# Patient Record
Sex: Male | Born: 1990 | Hispanic: No | Marital: Married | Smoking: Current every day smoker
Health system: Southern US, Community
[De-identification: ages and names within clinical notes are randomized; demographics above are authoritative.]

## PROBLEM LIST (undated history)

## (undated) DIAGNOSIS — F419 Anxiety disorder, unspecified: Secondary | ICD-10-CM

## (undated) DIAGNOSIS — D649 Anemia, unspecified: Secondary | ICD-10-CM

## (undated) DIAGNOSIS — R519 Headache, unspecified: Secondary | ICD-10-CM

## (undated) DIAGNOSIS — K5909 Other constipation: Secondary | ICD-10-CM

## (undated) HISTORY — DX: Other constipation: K59.09

## (undated) HISTORY — DX: Anemia, unspecified: D64.9

## (undated) HISTORY — DX: Headache, unspecified: R51.9

## (undated) HISTORY — DX: Anxiety disorder, unspecified: F41.9

---

## 2020-02-23 ENCOUNTER — Ambulatory Visit: Payer: Self-pay | Admitting: Family Medicine

## 2020-02-23 ENCOUNTER — Encounter: Payer: Self-pay | Admitting: Family Medicine

## 2020-02-23 ENCOUNTER — Other Ambulatory Visit: Payer: Self-pay

## 2020-02-23 VITALS — BP 110/61 | HR 65 | Temp 97.7°F | Resp 16 | Ht 70.25 in | Wt 145.0 lb

## 2020-02-23 DIAGNOSIS — Z7689 Persons encountering health services in other specified circumstances: Secondary | ICD-10-CM

## 2020-02-23 DIAGNOSIS — D509 Iron deficiency anemia, unspecified: Secondary | ICD-10-CM

## 2020-02-23 DIAGNOSIS — Z833 Family history of diabetes mellitus: Secondary | ICD-10-CM

## 2020-02-23 DIAGNOSIS — K5900 Constipation, unspecified: Secondary | ICD-10-CM

## 2020-02-23 DIAGNOSIS — B769 Hookworm disease, unspecified: Secondary | ICD-10-CM

## 2020-02-23 MED ORDER — POLYETHYLENE GLYCOL 3350 17 GM/SCOOP PO POWD: 17.0000 g | Freq: Every day | ORAL | 1 refills | Status: AC | PRN

## 2020-02-23 MED ORDER — ALBENDAZOLE 200 MG PO TABS: 400.0000 mg | ORAL_TABLET | Freq: Once | ORAL | 0 refills | Status: AC

## 2020-02-23 NOTE — Patient Instructions (Addendum)
Thank you for coming to the office today.  Take medicine for hookworm as discussed.  For Constipation (less frequent bowel movement that can be hard dry or involve straining).  Recommend trying OTC Miralax 17g = 1 capful in large glass water once daily for now, try several days to see if working, goal is soft stool or BM 1-2 times daily, if too loose then reduce dose or try every other day. If not effective may need to increase it to 2 doses at once in AM or may do 1 in morning and 1 in afternoon/evening  - This medicine is very safe and can be used often without any problem and will not make you dehydrated. It is good for use on AS NEEDED BASIS or even MAINTENANCE therapy for longer term for several days to weeks at a time to help regulate bowel movements  Other more natural remedies or preventative treatment: - Increase hydration with water - Increase fiber in diet (high fiber foods = vegetables, leafy greens, oats/grains) - May take OTC Fiber supplement (metamucil powder or pill/gummy) - May try OTC Probiotic  We will look into the cost of the Stool Test - for [facility] Test Code 4792  Please schedule a Follow-up Appointment to: Return in about 26 days (around 03/20/2020) for 4 weeks 8/24 - constipation follow-up, close to time/day as his spouse.  If you have any other questions or concerns, please feel free to call the office or send a message through MyChart. You may also schedule an earlier appointment if necessary.  Additionally, you may be receiving a survey about your experience at our office within a few days to 1 week by e-mail or mail. We value your feedback.  Saralyn Pilar, DO [facility], [place]

## 2020-02-23 NOTE — Progress Notes (Signed)
Subjective:    Patient ID: Eric Singleton, male    DOB: [birth year], 29 y.o.   MRN: [id number]  Eric Singleton is a 29 y.o. male presenting on 02/23/2020 for Establish Care (chronic constipation)  Here to establish new patient. He is accompanied by his wife, Eric Singleton. He prefers [place] Patois/Patwa and does understand most English. He has moved here from [place] recently within the past 2 years.  HPI   Chronic Constipation Reports chronic problem. Describes harder dry stool, smaller amount 1 x BM daily, with some straining. Tried OTC medication, Senna plus, Dulcolax, Heated prune juice, milk of magnesia, miralax low doses temporary relief but not regular benefit - Asking about lactose intolerance, can have bloating and cramping. - Admits gas build-up. Taking Gas-X PRN - Tried Probiotic - Florastor limited relief , he does not like taking pills - Worried about hookworm, has been diagnosed before in [place], has taken treatment before, requesting medicine for this now.  History of Anemia  Adjustment Disorder with Depression / Anxiety Reports various stressors causing him issues with mood and anxiety. No prior formal diagnosis of major depression. Never treated with medication for this before. His wife admits that both she and some aspects of life in [place] are factors for his stress.   Depression screen PHQ 2/9 02/23/2020  Decreased Interest 2  Down, Depressed, Hopeless 1  PHQ - 2 Score 3  Altered sleeping 1  Tired, decreased energy 2  Change in appetite 3  Feeling bad or failure about yourself  0  Trouble concentrating 2  Moving slowly or fidgety/restless 0  Suicidal thoughts 0  PHQ-9 Score 11  Difficult doing work/chores Somewhat difficult     GAD 7 : Generalized Anxiety Score 02/23/2020  Nervous, Anxious, on Edge 1  Control/stop worrying 2  Worry too much - different things 1  Trouble relaxing 2  Restless 1  Easily annoyed or irritable 2  Afraid - awful might happen 1  Total GAD  7 Score 10  Anxiety Difficulty Somewhat difficult     Past Medical History:  Diagnosis Date  . Anemia   . Anxiety   . Chronic constipation   . Headache    History reviewed. No pertinent surgical history. Social History   Socioeconomic History  . Marital status: Married    Spouse name: Not on file  . Number of children: Not on file  . Years of education: Not on file  . Highest education level: Not on file  Occupational History  . Not on file  Tobacco Use  . Smoking status: Never Smoker  . Smokeless tobacco: Never Used  Substance and Sexual Activity  . Alcohol use: Yes  . Drug use: Never  . Sexual activity: Not on file  Other Topics Concern  . Not on file  Social History Narrative  . Not on file   Social Determinants of Health   Financial Resource Strain:   . Difficulty of Paying Living Expenses:   Food Insecurity:   . Worried About Programme researcher, broadcasting/film/video in the Last Year:   . Barista in the Last Year:   Transportation Needs:   . Freight forwarder (Medical):   Marland Kitchen Lack of Transportation (Non-Medical):   Physical Activity:   . Days of Exercise per Week:   . Minutes of Exercise per Session:   Stress:   . Feeling of Stress :   Social Connections:   . Frequency of Communication with Friends and Family:   .  Frequency of Social Gatherings with Friends and Family:   . Attends Religious Services:   . Active Member of Clubs or Organizations:   . Attends Banker Meetings:   Marland Kitchen Marital Status:   Intimate Partner Violence:   . Fear of Current or Ex-Partner:   . Emotionally Abused:   Marland Kitchen Physically Abused:   . Sexually Abused:    History reviewed. No pertinent family history. Current Outpatient Medications on File Prior to Visit  Medication Sig  . famotidine (PEPCID) 10 MG tablet Take 10 mg by mouth 2 (two) times daily.  Marland Kitchen senna-docusate (SENOKOT-S) 8.6-50 MG tablet Take 1 tablet by mouth daily.   No current facility-administered medications on  file prior to visit.    Review of Systems Per HPI unless specifically indicated above      Objective:    BP (!) 110/61   Pulse 65   Temp 97.7 F (36.5 C) (Temporal)   Resp 16   Ht 5' 10.25" (1.784 m)   Wt 145 lb (65.8 kg)   SpO2 100%   BMI 20.66 kg/m   Wt Readings from Last 3 Encounters:  02/23/20 145 lb (65.8 kg)    Physical Exam Vitals and nursing note reviewed.  Constitutional:      General: He is not in acute distress.    Appearance: He is well-developed. He is not diaphoretic.     Comments: Well-appearing, comfortable, cooperative  HENT:     Head: Normocephalic and atraumatic.  Eyes:     General:        Right eye: No discharge.        Left eye: No discharge.     Conjunctiva/sclera: Conjunctivae normal.     Pupils: Pupils are equal, round, and reactive to light.  Neck:     Thyroid: No thyromegaly.  Cardiovascular:     Rate and Rhythm: Normal rate and regular rhythm.     Heart sounds: Normal heart sounds. No murmur heard.   Pulmonary:     Effort: Pulmonary effort is normal. No respiratory distress.     Breath sounds: Normal breath sounds. No wheezing or rales.  Abdominal:     General: Bowel sounds are normal. There is no distension.     Palpations: Abdomen is soft. There is no mass.     Tenderness: There is no abdominal tenderness.  Musculoskeletal:        General: No tenderness. Normal range of motion.     Cervical back: Normal range of motion and neck supple.     Comments: Upper / Lower Extremities: - Normal muscle tone, strength bilateral upper extremities 5/5, lower extremities 5/5  Lymphadenopathy:     Cervical: No cervical adenopathy.  Skin:    General: Skin is warm and dry.     Findings: No erythema or rash.  Neurological:     Mental Status: He is alert and oriented to person, place, and time.     Comments: Distal sensation intact to light touch all extremities  Psychiatric:        Behavior: Behavior normal.     Comments: Well groomed, good  eye contact, normal speech and thoughts      No results found for this or any previous visit.    Assessment & Plan:   Problem List Items Addressed This Visit    None    Visit Diagnoses    Constipation, unspecified constipation type    -  Primary   Relevant Medications   polyethylene glycol  powder (GLYCOLAX/MIRALAX) 17 GM/SCOOP powder   Encounter to establish care with new doctor       Hookworm infection       Relevant Medications   albendazole (ALBENZA) 200 MG tablet   Iron deficiency anemia, unspecified iron deficiency anemia type       Relevant Orders   CBC with Differential/Platelet   Comprehensive metabolic panel   Iron, TIBC and Ferritin Panel   Family history of diabetes mellitus       Relevant Orders   Hemoglobin A1c      Regarding constipation Failed most therapy Trial of Miralax higher dose up to 1-4 caps daily recommended, goal to find balance for maintenance dose.  Encouraged improvement to lifestyle with diet and exercise Maintain weight Goal to improve appetite and diet, can take boost/ensure if needed for nutrition Did not offer appetite stimulant as asked by patient's wife  History of anemia - request lab Possible hookworm infection Reviewed species local to [place] Will trial on Albendazole 400mg  x 1 dose - x 2 of the 200mg  pills, goodrx coupon given, sent rx, empiric therapy, they decline stool test ova & parasite, [facility] diagnostic cost is $180 for stool study in future if indicated.  Cannot tolerate iron supplement due to constipation Will check labs - [facility] - CMET CBC A1c , Anemia panel  Meds ordered this encounter  Medications  . polyethylene glycol powder (GLYCOLAX/MIRALAX) 17 GM/SCOOP powder    Sig: Take 17 g by mouth daily as needed for mild constipation. May increase dose up to 2-4 capfuls per day or more if needed.    Dispense:  3350 g    Refill:  1  . albendazole (ALBENZA) 200 MG tablet    Sig: Take 2 tablets (400 mg total) by mouth  once for 1 dose.    Dispense:  2 tablet    Refill:  0      Follow up plan: Return in about 26 days (around 03/20/2020) for 4 weeks 8/24 - constipation follow-up, close to time/day as his spouse.  03/22/2020, DO [facility] 02/23/2020, 10:45 AM

## 2020-03-20 ENCOUNTER — Ambulatory Visit: Payer: Self-pay | Admitting: Family Medicine

## 2020-03-23 ENCOUNTER — Ambulatory Visit: Payer: Self-pay | Admitting: Family Medicine

## 2020-03-30 ENCOUNTER — Ambulatory Visit: Payer: Self-pay | Admitting: Family Medicine

## 2020-04-06 ENCOUNTER — Ambulatory Visit (INDEPENDENT_AMBULATORY_CARE_PROVIDER_SITE_OTHER): Payer: Self-pay | Admitting: Family Medicine

## 2020-04-06 ENCOUNTER — Other Ambulatory Visit: Payer: Self-pay

## 2020-04-06 ENCOUNTER — Encounter: Payer: Self-pay | Admitting: Family Medicine

## 2020-04-06 VITALS — BP 115/66 | HR 57 | Temp 97.3°F | Resp 16 | Ht 72.0 in | Wt 151.0 lb

## 2020-04-06 DIAGNOSIS — F419 Anxiety disorder, unspecified: Secondary | ICD-10-CM

## 2020-04-06 DIAGNOSIS — K5900 Constipation, unspecified: Secondary | ICD-10-CM

## 2020-04-06 DIAGNOSIS — E739 Lactose intolerance, unspecified: Secondary | ICD-10-CM | POA: Insufficient documentation

## 2020-04-06 NOTE — Progress Notes (Signed)
Subjective:    Patient ID: Eric Singleton, male    DOB: 09-21-1990, 29 y.o.   MRN: [id number]  Eric Singleton is a 29 y.o. male presenting on 04/06/2020 for Constipation (follow up improved)  Here to establish new patient. He is accompanied by his wife, [name]. He prefers [place] Patois/Patwa and does understand most English.  HPI  Chronic Constipation Weight Loss  Follow up from last appointment 01/2020, he was started on miralax and treated empirically for hookworm with albendazole Interval update, he has done very well He has gained weight 5-6 lbs, around 145 to 151 lbs in 2 months He is now taking miralax 1 dose DAILY with coffee and has more regular BMs He is lactose intolerant. He has switched to lactose free milk, and does better. Was having more bloating. Tried OTC medication, Senna PRN Improved diet Off lactose contain products, less bloating  Adjustment Disorder with Depression / Anxiety Improved depressive symptoms, now PHQ 0  He has some residual anxiety, see score below Not interested in treatment  Health Maintenance: Declines Flu vaccine at this time.  Depression screen [facility] 2/9 04/06/2020 02/23/2020  Decreased Interest 0 2  Down, Depressed, Hopeless 0 1  PHQ - 2 Score 0 3  Altered sleeping 0 1  Tired, decreased energy 0 2  Change in appetite 0 3  Feeling bad or failure about yourself  0 0  Trouble concentrating 0 2  Moving slowly or fidgety/restless 0 0  Suicidal thoughts 0 0  PHQ-9 Score 0 11  Difficult doing work/chores Not difficult at all Somewhat difficult   GAD 7 : Generalized Anxiety Score 02/23/2020  Nervous, Anxious, on Edge 1  Control/stop worrying 2  Worry too much - different things 1  Trouble relaxing 2  Restless 1  Easily annoyed or irritable 2  Afraid - awful might happen 1  Total GAD 7 Score 10  Anxiety Difficulty Somewhat difficult     Social History   Tobacco Use  . Smoking status: Never Smoker  . Smokeless tobacco: Never Used    Substance Use Topics  . Alcohol use: Yes  . Drug use: Never    Review of Systems Per HPI unless specifically indicated above     Objective:    BP 115/66   Pulse (!) 57   Temp (!) 97.3 F (36.3 C) (Temporal)   Resp 16   Ht 6' (1.829 m)   Wt 151 lb (68.5 kg)   SpO2 100%   BMI 20.48 kg/m   Wt Readings from Last 3 Encounters:  04/06/20 151 lb (68.5 kg)  02/23/20 145 lb (65.8 kg)    Physical Exam Vitals and nursing note reviewed.  Constitutional:      General: He is not in acute distress.    Appearance: He is well-developed. He is not diaphoretic.     Comments: Well-appearing, comfortable, cooperative  HENT:     Head: Normocephalic and atraumatic.  Eyes:     General:        Right eye: No discharge.        Left eye: No discharge.     Conjunctiva/sclera: Conjunctivae normal.  Neck:     Thyroid: No thyromegaly.  Cardiovascular:     Rate and Rhythm: Normal rate and regular rhythm.     Heart sounds: Normal heart sounds. No murmur heard.   Pulmonary:     Effort: Pulmonary effort is normal. No respiratory distress.     Breath sounds: Normal breath sounds. No wheezing or  rales.  Musculoskeletal:        General: Normal range of motion.     Cervical back: Normal range of motion and neck supple.  Lymphadenopathy:     Cervical: No cervical adenopathy.  Skin:    General: Skin is warm and dry.     Findings: No erythema or rash.  Neurological:     Mental Status: He is alert and oriented to person, place, and time.  Psychiatric:        Behavior: Behavior normal.     Comments: Well groomed, good eye contact, normal speech and thoughts      No results found for this or any previous visit.    Assessment & Plan:   Problem List Items Addressed This Visit    Lactose intolerance   Constipation - Primary      No orders of the defined types were placed in this encounter.  Improved constipation on miralax daily May titrate up PRN if need for flare Avoid lactose contain  products Encouraged improvement to lifestyle with diet and exercise Maintain weight Goal to improve appetite and diet, can take boost/ensure if needed for nutrition  Follow up anxiety if worse or new concerns or interested in treatment  Follow up plan: Return in about 1 year (around 04/06/2021) for [employer]  Declines labs at this time, he can come in AM fasting apt, and we can discuss option of lab in office, he has issue with nearly or passing out each time if blood draw or injection  Saralyn Pilar, DO [facility] 04/06/2020, 11:26 AM

## 2020-04-06 NOTE — Patient Instructions (Addendum)
Thank you for coming to the office today.  Keep up the good work. Continue miralax 1 cap daily.   DUE for FASTING BLOOD WORK (no food or drink after midnight before the lab appointment, only water or coffee without cream/sugar on the morning of)  SCHEDULE "Lab Only" visit in the morning at the clinic for lab draw in 1 YEAR  - Make sure Lab Only appointment is at about 1 week before your next appointment, so that results will be available  For Lab Results, once available within 2-3 days of blood draw, you can can log in to MyChart online to view your results and a brief explanation. Also, we can discuss results at next follow-up visit.   Please schedule a Follow-up Appointment to: Return in about 1 year (around 04/06/2021) for [employer]  If you have any other questions or concerns, please feel free to call the office or send a message through MyChart. You may also schedule an earlier appointment if necessary.  Additionally, you may be receiving a survey about your experience at our office within a few days to 1 week by e-mail or mail. We value your feedback.  Saralyn Pilar, DO [facility], [place]

## 2020-04-20 ENCOUNTER — Telehealth: Payer: Self-pay | Admitting: Family Medicine

## 2020-08-04 ENCOUNTER — Emergency Department: Payer: No Typology Code available for payment source

## 2020-08-04 ENCOUNTER — Other Ambulatory Visit: Payer: Self-pay

## 2020-08-04 DIAGNOSIS — S8991XA Unspecified injury of right lower leg, initial encounter: Secondary | ICD-10-CM | POA: Diagnosis not present

## 2020-08-04 DIAGNOSIS — S3992XA Unspecified injury of lower back, initial encounter: Secondary | ICD-10-CM | POA: Diagnosis present

## 2020-08-04 DIAGNOSIS — Y92410 Unspecified street and highway as the place of occurrence of the external cause: Secondary | ICD-10-CM | POA: Insufficient documentation

## 2020-08-04 DIAGNOSIS — T1490XA Injury, unspecified, initial encounter: Secondary | ICD-10-CM
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
## 2020-08-05 ENCOUNTER — Emergency Department
Admission: EM | Admit: 2020-08-05 | Discharge: 2020-08-05 | Disposition: A | Discharge status: Home / Self Care | Payer: No Typology Code available for payment source | Attending: Emergency Medicine | Admitting: Emergency Medicine

## 2020-08-05 ENCOUNTER — Emergency Department: Payer: No Typology Code available for payment source

## 2020-08-05 ENCOUNTER — Encounter: Payer: Self-pay | Admitting: Radiology

## 2020-08-05 DIAGNOSIS — T1490XA Injury, unspecified, initial encounter: Secondary | ICD-10-CM

## 2020-08-05 MED ORDER — OXYCODONE-ACETAMINOPHEN 5-325 MG PO TABS
1.0000 | ORAL_TABLET | Freq: Once | ORAL | Status: AC
  Administered 2020-08-05: 1 via ORAL
  Filled 2020-08-05: qty 1

## 2020-08-05 MED ORDER — IBUPROFEN 800 MG PO TABS: 800.0000 mg | ORAL_TABLET | Freq: Three times a day (TID) | ORAL | 0 refills | Status: DC | PRN

## 2020-08-05 MED ORDER — CYCLOBENZAPRINE HCL 10 MG PO TABS: 10.0000 mg | ORAL_TABLET | Freq: Three times a day (TID) | ORAL | 0 refills | Status: DC | PRN
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
## 2020-08-07 ENCOUNTER — Ambulatory Visit: Payer: Self-pay | Admitting: Family Medicine

## 2020-08-07 ENCOUNTER — Encounter: Payer: Self-pay | Admitting: Family Medicine

## 2020-08-07 ENCOUNTER — Other Ambulatory Visit: Payer: Self-pay

## 2020-08-07 DIAGNOSIS — S8001XA Contusion of right knee, initial encounter: Secondary | ICD-10-CM

## 2020-08-07 DIAGNOSIS — M25561 Pain in right knee: Secondary | ICD-10-CM

## 2020-08-07 DIAGNOSIS — M62838 Other muscle spasm: Secondary | ICD-10-CM

## 2020-08-07 MED ORDER — KETOROLAC TROMETHAMINE 60 MG/2ML IM SOLN
60.0000 mg | Freq: Once | INTRAMUSCULAR | Status: DC
Start: 2020-08-07 — End: 2020-08-07

## 2020-08-07 MED ORDER — BACLOFEN 10 MG PO TABS: 5.0000 mg | ORAL_TABLET | Freq: Three times a day (TID) | ORAL | 1 refills | Status: DC | PRN

## 2020-08-07 NOTE — Patient Instructions (Addendum)
  Start taking Baclofen (Lioresal) 10mg  (muscle relaxant) - start with one pill at night as needed for next 1-3 nights (may make you drowsy, caution with driving) see how it affects you, then if tolerated increase to one pill 2 to 3 times a day or (every 8 hours as needed)  Ibuprofen as needed.  Follow-up if unresolved.  Please schedule a Follow-up Appointment to: Return in about 4 weeks (around 09/04/2020), or if symptoms worsen or fail to improve.  If you have any other questions or concerns, please feel free to call the office or send a message through MyChart. You may also schedule an earlier appointment if necessary.  Additionally, you may be receiving a survey about your experience at our office within a few days to 1 week by e-mail or mail. We value your feedback.  11/02/2020, DO [facility], [facility]
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
## 2021-04-11 ENCOUNTER — Encounter: Payer: Self-pay | Admitting: Family Medicine
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
## 2023-01-26 ENCOUNTER — Ambulatory Visit: Payer: Self-pay

## 2023-01-26 NOTE — Telephone Encounter (Signed)
Chief Complaint: Depression Symptoms: Depression, weight loss, loss of appetite, worried about family.  Frequency: Daily for about 1 month Pertinent Negatives: Patient denies thoughts of self-harm or harm to others  Disposition: [] ED /[] Urgent Care (no appt availability in office) / [x] Appointment(In office/virtual)/ []   Virtual Care/ [] Home Care/ [] Refused Recommended Disposition /[]  Mobile Bus/ []  Follow-up with PCP Additional Notes: Called and spoke to patient who gave permission to speak to his wife. The patient stated that he does not have an appetite and unable to eat much. Patient also stated that he is having a hard time focusing on work and had to call out of work today and yesterday. He is worried about his mother and daughter who are in the hospital in another country and he can not get there to check on them. His wife reports that the patient has lost weight, about 30 lbs over the past 3 months, has a decreased appetite, has had loud out bust at home and has started smoking cigarettes recently. Patient and wife are requesting an appointment for further evaluation. Appointment was scheduled for tomorrow with the float provider. Also advised that I would send message to PCP office to see if he can be seen sooner than scheduled appointment. Advised wife to take the patient to urgent care if symptoms get worse before scheduled appointment.  Summary: Weight loss, weakness   The spouse of the patient called in stating the patient has not felt well for several months. He has been losing weight, experiencing weakness, and some bouts of depression. There are no appts available until Wednesday with Nicki Reaper. The spouse just wanted a nurse to call the patient to triage him. Please assist patient further     Reason for Disposition  [1] Depression AND [2] worsening (e.g., sleeping poorly, less able to do activities of daily living)  Answer Assessment - Initial Assessment  Questions 1. CONCERN: "What happened that made you call today?"     Patient has not been hisself over the past month. A lot of outburst later. His daughter and mother are both in the hospital in another country and he can not get to them right now.  2. DEPRESSION SYMPTOM SCREENING: "How are you feeling overall?" (e.g., decreased energy, increased sleeping or difficulty sleeping, difficulty concentrating, feelings of sadness, guilt, hopelessness, or worthlessness)     Weak, does not have an appetite, unable to eat, not sleeping much, worried about family members.  3. RISK OF HARM - SUICIDAL IDEATION:  "Do you ever have thoughts of hurting or killing yourself?"  (e.g., yes, no, no but preoccupation with thoughts about death)   - INTENT:  "Do you have thoughts of hurting or killing yourself right NOW?" (e.g., yes, no, N/A)   - PLAN: "Do you have a specific plan for how you would do this?" (e.g., gun, knife, overdose, no plan, N/A)     No  4. RISK OF HARM - HOMICIDAL IDEATION:  "Do you ever have thoughts of hurting or killing someone else?"  (e.g., yes, no, no but preoccupation with thoughts about death)   - INTENT:  "Do you have thoughts of hurting or killing someone right NOW?" (e.g., yes, no, N/A)   - PLAN: "Do you have a specific plan for how you would do this?" (e.g., gun, knife, no plan, N/A)      No 5. FUNCTIONAL IMPAIRMENT: "How have things been going for you overall? Have you had more difficulty than usual doing your normal daily  activities?"  (e.g., better, same, worse; self-care, school, work, interactions)     Yes, I can not eat or sleep. It is hard to focus on work. He has called out of work 2 times in a roll.  6. SUPPORT: "Who is with you now?" "Who do you live with?" "Do you have family or friends who you can talk to?"      Yes, my wife and family that I can talk to.  7. THERAPIST: "Do you have a counselor or therapist? Name?"     No  8. STRESSORS: "Has there been any new stress or  recent changes in your life?"     Mother and daughter in the hospital in another country and he can not get there to see them  9. ALCOHOL USE OR SUBSTANCE USE (DRUG USE): "Do you drink alcohol or use any illegal drugs?"     No, has been smoking cigarettes lately  10. OTHER: "Do you have any other physical symptoms right now?" (e.g., fever)       Weight loss 30lbs over 3 months, unable to eat much, feels faint, general weakness.  Protocols used: Depression-A-AH

## 2023-01-27 ENCOUNTER — Ambulatory Visit: Payer: Self-pay | Admitting: Physician Assistant

## 2023-01-27 ENCOUNTER — Ambulatory Visit (INDEPENDENT_AMBULATORY_CARE_PROVIDER_SITE_OTHER): Payer: Self-pay | Admitting: Internal Medicine

## 2023-01-27 ENCOUNTER — Ambulatory Visit: Payer: Self-pay | Admitting: *Deleted

## 2023-01-27 ENCOUNTER — Ambulatory Visit
Admission: RE | Admit: 2023-01-27 | Discharge: 2023-01-27 | Disposition: A | Discharge status: Home / Self Care | Payer: Self-pay | Attending: Internal Medicine | Admitting: Internal Medicine

## 2023-01-27 ENCOUNTER — Ambulatory Visit
Admission: RE | Admit: 2023-01-27 | Discharge: 2023-01-27 | Disposition: A | Discharge status: Home / Self Care | Payer: Self-pay | Source: Ambulatory Visit | Attending: Internal Medicine | Admitting: Internal Medicine

## 2023-01-27 VITALS — BP 102/60 | HR 76 | Temp 97.3°F | Ht 72.0 in | Wt 130.0 lb

## 2023-01-27 DIAGNOSIS — F321 Major depressive disorder, single episode, moderate: Secondary | ICD-10-CM

## 2023-01-27 DIAGNOSIS — K5909 Other constipation: Secondary | ICD-10-CM | POA: Insufficient documentation

## 2023-01-27 DIAGNOSIS — F99 Mental disorder, not otherwise specified: Secondary | ICD-10-CM

## 2023-01-27 DIAGNOSIS — F5105 Insomnia due to other mental disorder: Secondary | ICD-10-CM

## 2023-01-27 DIAGNOSIS — R636 Underweight: Secondary | ICD-10-CM

## 2023-01-27 MED ORDER — MIRTAZAPINE 7.5 MG PO TABS: 7.5000 mg | ORAL_TABLET | Freq: Every day | ORAL | 0 refills | Status: DC

## 2023-01-27 NOTE — Assessment & Plan Note (Signed)
Encouraged high fiber diet and adequate water intake He is requesting KUB to rule out obstruction although clinically I do not see signs of that on exam Encouraged him to take MiraLAX and senna daily as prescribed not as needed Advised him pending x-ray results, he could consider mag citrate OTC

## 2023-01-27 NOTE — Progress Notes (Signed)
Subjective:    Patient ID: Eric Singleton, male    DOB: 12-27-1990, 32 y.o.   MRN: [id number]  HPI  Patient presents to clinic today with multiple complaints.  He reports anxiety, depression, lack of appetite, unintentional weight loss. This started 3 months ago. He reports this is situational, his mother and daughter are hospitalized in another country and he is unable to go see them. He is not sleeping well. He has lost 20 lbs in the last 3 months. He has started smoking cigarrettes to try to cope. He has recently missed work because of his mood. He is not currently taking any medication for this. He denies anxiety, SI/HI.  He also reports constipation and abdominal pain. He noticed this 3 days ago. He has not had a BM in 3 days. He typically has a BM at least every other day. He denies nausea or vomiting but reports he would feel better if he vomited. He has tried mirilax and senekot with minimal results. He has a history of constipation and lactose intolerance.  Review of Systems     Past Medical History:  Diagnosis Date   Anemia    Anxiety    Chronic constipation    Headache     Current Outpatient Medications  Medication Sig Dispense Refill   baclofen (LIORESAL) 10 MG tablet Take 0.5-1 tablets (5-10 mg total) by mouth 3 (three) times daily as needed for muscle spasms. 30 each 1   ibuprofen (ADVIL) 800 MG tablet Take 1 tablet (800 mg total) by mouth every 8 (eight) hours as needed. 30 tablet 0   polyethylene glycol powder (GLYCOLAX/MIRALAX) 17 GM/SCOOP powder Take 17 g by mouth daily as needed for mild constipation. May increase dose up to 2-4 capfuls per day or more if needed. 3350 g 1   senna-docusate (SENOKOT-S) 8.6-50 MG tablet Take 1 tablet by mouth daily.     No current facility-administered medications for this visit.    No Known Allergies  No family history on file.  Social History   Socioeconomic History   Marital status: Married    Spouse name: Not on file    Number of children: Not on file   Years of education: Not on file   Highest education level: Not on file  Occupational History   Not on file  Tobacco Use   Smoking status: Never   Smokeless tobacco: Never  Substance and Sexual Activity   Alcohol use: Yes   Drug use: Never   Sexual activity: Not on file  Other Topics Concern   Not on file  Social History Narrative   Not on file   Social Determinants of Health   Financial Resource Strain: Not on file  Food Insecurity: Not on file  Transportation Needs: Not on file  Physical Activity: Not on file  Stress: Not on file  Social Connections: Not on file  Intimate Partner Violence: Not on file     Constitutional: Patient reports unintentional weight loss.  Denies fever, malaise, fatigue, headache.  Respiratory: Denies difficulty breathing, shortness of breath, cough or sputum production.   Cardiovascular: Denies chest pain, chest tightness, palpitations or swelling in the hands or feet.  Gastrointestinal: Patient reports abdominal pain and constipation.  Denies bloating, diarrhea or blood in the stool.  GU: Denies urgency, frequency, pain with urination, burning sensation, blood in urine, odor or discharge. Musculoskeletal: Denies decrease in range of motion, difficulty with gait, muscle pain or joint pain and swelling.  Skin: Denies redness,  rashes, lesions or ulcercations.  Neurological: Denies dizziness, difficulty with memory, difficulty with speech or problems with balance and coordination.  Psych: Patient reports anxiety depression.  Denies SI/HI.  No other specific complaints in a complete review of systems (except as listed in HPI above).  Objective:   Physical Exam   BP 102/60 (BP Location: Left Arm, Patient Position: Sitting, Cuff Size: Normal)   Pulse 76   Temp (!) 97.3 F (36.3 C) (Temporal)   Ht 6' (1.829 m)   Wt 130 lb (59 kg)   SpO2 99%   BMI 17.63 kg/m   Wt Readings from Last 3 Encounters:  08/07/20 141  lb (64 kg)  08/04/20 150 lb (68 kg)  04/06/20 151 lb (68.5 kg)    General: Appears his stated age, underweight, in NAD. Cardiovascular: Normal rate and rhythm. S1,S2 noted.  No murmur, rubs or gallops noted.  Pulmonary/Chest: Normal effort and positive vesicular breath sounds. No respiratory distress. No wheezes, rales or ronchi noted.  Abdomen: Soft and nontender.  Hyperactive bowel sounds. No distention or masses noted. Liver, spleen and kidneys non palpable. Musculoskeletal:  No difficulty with gait.  Neurological: Alert and oriented.  Coordination normal.  Psychiatric: Mood and affect flat. Behavior is normal. Judgment and thought content normal.         Assessment & Plan:     Follow-up with your PCP in 1 month for reevaluation Nicki Reaper, NP

## 2023-01-27 NOTE — Assessment & Plan Note (Signed)
He is not interested in seeing a therapist due to lack of insurance Will trial mirtazapine 7.5 mg to help with mood, appetite and sleep Support offered 

## 2023-01-27 NOTE — Assessment & Plan Note (Signed)
He is not interested in seeing a therapist due to lack of insurance Will trial mirtazapine 7.5 mg to help with mood, appetite and sleep Support offered

## 2023-01-27 NOTE — Telephone Encounter (Signed)
  Chief Complaint: Abd pain, constipation.   (Wife [name] called in but he was in the background answering the questions). Symptoms: abd pain, wt. Loss, constipation and anxiety and depression  Anxiety and depression triaged 01/26/2023 and he had an appt made with Jacquelin Hawking, PA at [facility] but the agent was able to get him in today with Nicki Reaper, NP at [facility] at 2:00 today    Wife called back in because of the abd pain and wt. Loss and poor appetite. Frequency: Abd pain for 3 days and constipation with pain radiating into his back. Pertinent Negatives: Patient denies vomiting but tried to vomit this morning and could not. Disposition: [] ED /[] Urgent Care (no appt availability in office) / [x] Appointment(In office/virtual)/ []  [place] [facility]/ [] Home Care/ [] Refused Recommended Disposition /[] [place] [facility]/ []  Follow-up with PCP Additional Notes: Agent made him an appt for today with Nicki Reaper NP for 2:00 and cancelled the appt with Jacquelin Hawking, PA-C at [facility]) that was for today.

## 2023-01-27 NOTE — Patient Instructions (Signed)

## 2023-01-27 NOTE — Telephone Encounter (Signed)
Reason for Disposition  [1] MODERATE pain (e.g., interferes with normal activities) AND [2] pain comes and goes (cramps) AND [3] present > 24 hours  (Exception: Pain with Vomiting or Diarrhea - see that Guideline.)  Answer Assessment - Initial Assessment Questions 1. LOCATION: "Where does it hurt?"      Wife [name] calling in.    I asked to speak with him directly if possible.   So she got him to the phone. It's hurting at the bottom of his abd. 2. RADIATION: "Does the pain shoot anywhere else?" (e.g., chest, back)     It's  going into his back.   3. ONSET: "When did the pain begin?" (Minutes, hours or days ago)      Started hurting 3 days ago.    4. SUDDEN: "Gradual or sudden onset?"     Not asked 5. PATTERN "Does the pain come and go, or is it constant?"    - If it comes and goes: "How long does it last?" "Do you have pain now?"     (Note: Comes and goes means the pain is intermittent. It goes away completely between bouts.)    - If constant: "Is it getting better, staying the same, or getting worse?"      (Note: Constant means the pain never goes away completely; most serious pain is constant and gets worse.)      He tried to vomit this morning.    But could not.   He has not eaten anything.    I gave him Miralax (wife talking with me but pt in the background).   He did have a little hard stool come out.   Last BM before the Miralax was 3 days.   Miralax was taken yesterday and last night. Wife said I'm a Engineer, civil (consulting).    He has a lot of anxiety and depression and has lost 30 lbs over the last 2 months.   He has been under a lot stress lately. I can see that he is sick.    6. SEVERITY: "How bad is the pain?"  (e.g., Scale 1-10; mild, moderate, or severe)    - MILD (1-3): Doesn't interfere with normal activities, abdomen soft and not tender to touch.     - MODERATE (4-7): Interferes with normal activities or awakens from sleep, abdomen tender to touch.     - SEVERE (8-10): Excruciating pain,  doubled over, unable to do any normal activities.       She wanted an KUB ordered before he came in today to see Nicki Reaper, NP.    7. RECURRENT SYMPTOM: "Have you ever had this type of stomach pain before?" If Yes, ask: "When was the last time?" and "What happened that time?"      Yes he has constipation for years as a problem.     Him not eating is from the anxiety and depression. He is from [place] and having a lot of stress and anxiety about what is going on over there. 8. CAUSE: "What do you think is causing the stomach pain?"     Wife, [name] thinks he might have a bowel obstruction and was wanting a KUB ordered before he was seen today.   I let her know Rene Kocher would need to see him first before ordering any tests.   He has an appt with her today at 2:00. 9. RELIEVING/AGGRAVATING FACTORS: "What makes it better or worse?" (e.g., antacids, bending or twisting motion, bowel movement)  Miralax is helping with the constipation but passing hard small stools 10. OTHER SYMPTOMS: "Do you have any other symptoms?" (e.g., back pain, diarrhea, fever, urination pain, vomiting)       Depression and anxiety, poor appetite and wt. loss  Protocols used: Abdominal Pain - Male-A-AH

## 2023-04-24 ENCOUNTER — Other Ambulatory Visit: Payer: Self-pay | Admitting: Internal Medicine
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
## 2023-10-29 ENCOUNTER — Telehealth: Payer: Self-pay | Admitting: Physician Assistant

## 2023-10-29 DIAGNOSIS — U071 COVID-19: Secondary | ICD-10-CM

## 2023-10-29 MED ORDER — BENZONATATE 100 MG PO CAPS: 100.0000 mg | ORAL_CAPSULE | Freq: Three times a day (TID) | ORAL | 0 refills | Status: AC | PRN

## 2023-10-29 MED ORDER — NIRMATRELVIR/RITONAVIR (PAXLOVID)TABLET: 3.0000 | ORAL_TABLET | Freq: Two times a day (BID) | ORAL | 0 refills | Status: AC

## 2023-10-29 MED ORDER — ONDANSETRON 4 MG PO TBDP: 4.0000 mg | ORAL_TABLET | Freq: Three times a day (TID) | ORAL | 0 refills | Status: AC | PRN

## 2023-10-29 NOTE — Progress Notes (Signed)
 Virtual Visit Consent   Eric Singleton, you are scheduled for a virtual visit with a [facility] [facility] provider today. Just as with appointments in the office, your consent must be obtained to participate. Your consent will be active for this visit and any virtual visit you may have with one of our providers in the next 365 days. If you have a MyChart account, a copy of this consent can be sent to you electronically.  As this is a virtual visit, video technology does not allow for your provider to perform a traditional examination. This may limit your provider's ability to fully assess your condition. If your provider identifies any concerns that need to be evaluated in person or the need to arrange testing (such as labs, EKG, etc.), we will make arrangements to do so. Although advances in technology are sophisticated, we cannot ensure that it will always work on either your end or our end. If the connection with a video visit is poor, the visit may have to be switched to a telephone visit. With either a video or telephone visit, we are not always able to ensure that we have a secure connection.  By engaging in this virtual visit, you consent to the provision of healthcare and authorize for your insurance to be billed (if applicable) for the services provided during this visit. Depending on your insurance coverage, you may receive a charge related to this service.  I need to obtain your verbal consent now. Are you willing to proceed with your visit today? Eric Singleton has provided verbal consent on 10/29/2023 for a virtual visit (video or telephone). Piedad Climes, [place]  Date: 10/29/2023 8:29 AM   Virtual Visit via Video Note   I, Piedad Climes, connected with  Eric Singleton  ([id number], 1991-05-13) on 10/29/23 at  8:00 AM EDT by a video-enabled telemedicine application and verified that I am speaking with the correct person using two identifiers.  Location: Patient: Virtual Visit Location Patient:  Home Provider: Virtual Visit Location Provider: Home Office   I discussed the limitations of evaluation and management by telemedicine and the availability of in person appointments. The patient expressed understanding and agreed to proceed.    History of Present Illness: Eric Singleton is a 33 y.o. who identifies as a male who was assigned male at birth, and is being seen today for 2 days ago with body aches, stomach aches, fatigue and no taste.  Patient also notes head congestion, nasal congestion and dry cough. Denies diarrhea but some nausea with an episode of vomiting yesterday. Notes occasional chest tightness and mild SOBOE. Has been exposed to COVID-19 from family member.    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 May increase dose up to 2-4 capfuls per day or more if needed., Disp: 3350 g, Rfl: 1   senna-docusate (SENOKOT-S) 8.6-50 MG tablet, Take 1 tablet by mouth  daily., Disp: , Rfl:   Observations/Objective: Patient is well-developed, well-nourished in no acute distress.  Resting comfortably at home.  Head is normocephalic, atraumatic.  No labored breathing. Speech is clear and coherent with logical content.  Patient is alert and oriented at baseline.   Assessment and Plan: 1. COVID-19 (Primary)  Patient with multiple risk factors for complicated course of  illness. Discussed risks/benefits of antiviral medications including most common potential ADRs. Patient voiced understanding and would like to proceed with antiviral medication. They are candidate for Paxlovid as young without any renal history. Rx sent to pharmacy. Supportive measures, OTC medications and vitamin regimen reviewed. Tessalon and Zofran per orders. Quarantine reviewed in detail. Strict ER precautions discussed with patient.    Follow Up Instructions: I discussed the assessment and treatment plan with the patient. The patient was provided an opportunity to ask questions and all were answered. The patient agreed with the plan and demonstrated an understanding of the instructions.  A copy of instructions were sent to the patient via MyChart unless otherwise noted below.   The patient was advised to call back or seek an in-person evaluation if the symptoms worsen or if the condition fails to improve as anticipated.    Piedad Climes, PA-C

## 2023-10-29 NOTE — Patient Instructions (Signed)
 Reginia Forts, thank you for joining Piedad Climes, PA-C for today's virtual visit.  While this provider is not your primary care provider (PCP), if your PCP is located in our provider database this encounter information will be shared with them immediately following your visit.   A [facility] MyChart account gives you access to today's visit and all your visits, tests, and labs performed at [facility] " click here if you don't have a [facility] MyChart account or go to [email or web address]  Consent: (Patient) Eric Singleton provided verbal consent for this virtual visit at the beginning of the encounter.  Current Medications:  Current Outpatient Medications:    benzonatate (TESSALON) 100 MG capsule, Take 1 capsule (100 mg total) by mouth 3 (three) times daily as needed for cough., Disp: 30 capsule, Rfl: 0   nirmatrelvir/ritonavir (PAXLOVID) 20 x 150 MG & 10 x 100MG  TABS, Take 3 tablets by mouth 2 (two) times daily for 5 days. (Take nirmatrelvir 150 mg two tablets twice daily for 5 days and ritonavir 100 mg one tablet twice daily for 5 days) Patient GFR is > 60, Disp: 30 tablet, Rfl: 0   ondansetron (ZOFRAN-ODT) 4 MG disintegrating tablet, Take 1 tablet (4 mg total) by mouth every 8 (eight) hours as needed for nausea or vomiting., Disp: 20 tablet, Rfl: 0   mirtazapine (REMERON) 7.5 MG tablet, TAKE 1 TABLET BY MOUTH AT BEDTIME., Disp: 90 tablet, Rfl: 0   polyethylene glycol powder (GLYCOLAX/MIRALAX) 17 GM/SCOOP powder, Take 17 g by mouth daily as needed for mild constipation. May increase dose up to 2-4 capfuls per day or more if needed., Disp: 3350 g, Rfl: 1   senna-docusate (SENOKOT-S) 8.6-50 MG tablet, Take 1 tablet by mouth daily., Disp: , Rfl:    Medications ordered in this encounter:  Meds ordered this encounter  Medications   ondansetron (ZOFRAN-ODT) 4 MG disintegrating tablet    Sig: Take 1 tablet (4 mg total) by mouth every 8 (eight) hours as needed for nausea or  vomiting.    Dispense:  20 tablet    Refill:  0    Supervising Provider:   Merrilee Jansky [[id number]]   benzonatate (TESSALON) 100 MG capsule    Sig: Take 1 capsule (100 mg total) by mouth 3 (three) times daily as needed for cough.    Dispense:  30 capsule    Refill:  0    Supervising Provider:   Merrilee Jansky [[id number]]   nirmatrelvir/ritonavir (PAXLOVID) 20 x 150 MG & 10 x 100MG  TABS    Sig: Take 3 tablets by mouth 2 (two) times daily for 5 days. (Take nirmatrelvir 150 mg two tablets twice daily for 5 days and ritonavir 100 mg one tablet twice daily for 5 days) Patient GFR is > 60    Dispense:  30 tablet    Refill:  0    Supervising Provider:   Merrilee Jansky [phone]     *If you need refills on other medications prior to your next appointment, please contact your pharmacy*  Follow-Up: Call back or seek an in-person evaluation if the symptoms worsen or if the condition fails to improve as anticipated.  [facility] [phone]  Care Instructions: Please keep well-hydrated and get plenty of rest. Start a saline nasal rinse to flush out your nasal passages. You can use plain Mucinex to help thin congestion. If you have a humidifier, running in the bedroom at night. I want you to start OTC vitamin  D3 1000 units daily, vitamin C 1000 mg daily, and a zinc supplement. Please take prescribed medications as directed.      Isolation Instructions: You are to isolate at home until you have been fever free for at least 24 hours without a fever-reducing medication, and symptoms have been steadily improving for 24 hours. At that time,  you can end isolation but need to mask for an additional 5 days.   If you must be around other household members who do not have symptoms, you need to make sure that both you and the family members are masking consistently with a high-quality mask.  If you note any worsening of symptoms despite treatment, please seek an in-person  evaluation ASAP. If you note any significant shortness of breath or any chest pain, please seek ER evaluation. Please do not delay care!   COVID-19: What to Do if You Are Sick If you test positive and are an older adult or someone who is at high risk of getting very sick from COVID-19, treatment may be available. Contact a healthcare provider right away after a positive test to determine if you are eligible, even if your symptoms are mild right now. You can also visit a Test to Treat location and, if eligible, receive a prescription from a provider. Don't delay: Treatment must be started within the first few days to be effective. If you have a fever, cough, or other symptoms, you might have COVID-19. Most people have mild illness and are able to recover at home. If you are sick: Keep track of your symptoms. If you have an emergency warning sign (including trouble breathing), call 911. Steps to help prevent the spread of COVID-19 if you are sick If you are sick with COVID-19 or think you might have COVID-19, follow the steps below to care for yourself and to help protect other people in your home and community. Stay home except to get medical care Stay home. Most people with COVID-19 have mild illness and can recover at home without medical care. Do not leave your home, except to get medical care. Do not visit public areas and do not go to places where you are unable to wear a mask. Take care of yourself. Get rest and stay hydrated. Take over-the-counter medicines, such as acetaminophen, to help you feel better. Stay in touch with your doctor. Call before you get medical care. Be sure to get care if you have trouble breathing, or have any other emergency warning signs, or if you think it is an emergency. Avoid public transportation, ride-sharing, or taxis if possible. Get tested If you have symptoms of COVID-19, get tested. While waiting for test results, stay away from others, including staying apart  from those living in your household. Get tested as soon as possible after your symptoms start. Treatments may be available for people with COVID-19 who are at risk for becoming very sick. Don't delay: Treatment must be started early to be effective--some treatments must begin within 5 days of your first symptoms. Contact your healthcare provider right away if your test result is positive to determine if you are eligible. Self-tests are one of several options for testing for the virus that causes COVID-19 and may be more convenient than laboratory-based tests and point-of-care tests. Ask your healthcare provider or your local health department if you need help interpreting your test results. You can visit your state, tribal, local, and territorial health department's website to look for the latest local information on testing  sites. Separate yourself from other people As much as possible, stay in a specific room and away from other people and pets in your home. If possible, you should use a separate bathroom. If you need to be around other people or animals in or outside of the home, wear a well-fitting mask. Tell your close contacts that they may have been exposed to COVID-19. An infected person can spread COVID-19 starting 48 hours (or 2 days) before the person has any symptoms or tests positive. By letting your close contacts know they may have been exposed to COVID-19, you are helping to protect everyone. See COVID-19 and Animals if you have questions about pets. If you are diagnosed with COVID-19, someone from the health department may call you. Answer the call to slow the spread. Monitor your symptoms Symptoms of COVID-19 include fever, cough, or other symptoms. Follow care instructions from your healthcare provider and local health department. Your local health authorities may give instructions on checking your symptoms and reporting information. When to seek emergency medical attention Look for  emergency warning signs* for COVID-19. If someone is showing any of these signs, seek emergency medical care immediately: Trouble breathing Persistent pain or pressure in the chest New confusion Inability to wake or stay awake Pale, gray, or blue-colored skin, lips, or nail beds, depending on skin tone *This list is not all possible symptoms. Please call your medical provider for any other symptoms that are severe or concerning to you. Call 911 or call ahead to your local emergency facility: Notify the operator that you are seeking care for someone who has or may have COVID-19. Call ahead before visiting your doctor Call ahead. Many medical visits for routine care are being postponed or done by phone or telemedicine. If you have a medical appointment that cannot be postponed, call your doctor's office, and tell them you have or may have COVID-19. This will help the office protect themselves and other patients. If you are sick, wear a well-fitting mask You should wear a mask if you must be around other people or animals, including pets (even at home). Wear a mask with the best fit, protection, and comfort for you. You don't need to wear the mask if you are alone. If you can't put on a mask (because of trouble breathing, for example), cover your coughs and sneezes in some other way. Try to stay at least 6 feet away from other people. This will help protect the people around you. Masks should not be placed on young children under age 25 years, anyone who has trouble breathing, or anyone who is not able to remove the mask without help. Cover your coughs and sneezes Cover your mouth and nose with a tissue when you cough or sneeze. Throw away used tissues in a lined trash can. Immediately wash your hands with soap and water for at least 20 seconds. If soap and water are not available, clean your hands with an alcohol-based hand sanitizer that contains at least 60% alcohol. Clean your hands often Wash  your hands often with soap and water for at least 20 seconds. This is especially important after blowing your nose, coughing, or sneezing; going to the bathroom; and before eating or preparing food. Use hand sanitizer if soap and water are not available. Use an alcohol-based hand sanitizer with at least 60% alcohol, covering all surfaces of your hands and rubbing them together until they feel dry. Soap and water are the best option, especially if hands are  visibly dirty. Avoid touching your eyes, nose, and mouth with unwashed hands. Handwashing Tips Avoid sharing personal household items Do not share dishes, drinking glasses, cups, eating utensils, towels, or bedding with other people in your home. Wash these items thoroughly after using them with soap and water or put in the dishwasher. Clean surfaces in your home regularly Clean and disinfect high-touch surfaces (for example, doorknobs, tables, handles, light switches, and countertops) in your "sick room" and bathroom. In shared spaces, you should clean and disinfect surfaces and items after each use by the person who is ill. If you are sick and cannot clean, a caregiver or other person should only clean and disinfect the area around you (such as your bedroom and bathroom) on an as needed basis. Your caregiver/other person should wait as long as possible (at least several hours) and wear a mask before entering, cleaning, and disinfecting shared spaces that you use. Clean and disinfect areas that may have blood, stool, or body fluids on them. Use household cleaners and disinfectants. Clean visible dirty surfaces with household cleaners containing soap or detergent. Then, use a household disinfectant. Use a product from [employer] List N: Disinfectants for Coronavirus (COVID-19). Be sure to follow the instructions on the label to ensure safe and effective use of the product. Many products recommend keeping the surface wet with a disinfectant for a certain  period of time (look at "contact time" on the product label). You may also need to wear personal protective equipment, such as gloves, depending on the directions on the product label. Immediately after disinfecting, wash your hands with soap and water for 20 seconds. For completed guidance on cleaning and disinfecting your home, visit Complete Disinfection Guidance. Take steps to improve ventilation at home Improve ventilation (air flow) at home to help prevent from spreading COVID-19 to other people in your household. Clear out COVID-19 virus particles in the air by opening windows, using air filters, and turning on fans in your home. Use this interactive tool to learn how to improve air flow in your home. When you can be around others after being sick with COVID-19 Deciding when you can be around others is different for different situations. Find out when you can safely end home isolation. For any additional questions about your care, contact your healthcare provider or state or local health department. 10/16/2020 Content source: [facility] for Immunization and Respiratory Diseases ([facility]), Division of Viral Diseases This information is not intended to replace advice given to you by your health care provider. Make sure you discuss any questions you have with your health care provider. Document Revised: 11/29/2020 Document Reviewed: 11/29/2020 Elsevier Patient Education  2022 [employer].     If you have been instructed to have an in-person evaluation today at a local Urgent Care facility, please use the link below. It will take you to a list of all of our available [facility], including address, phone number and hours of operation. Please do not delay care.  [facility]  If you or a family member do not have a primary care provider, use the link below to schedule a visit and establish care. When you choose a [facility] primary care physician or advanced  practice provider, you gain a long-term partner in health. Find a Primary Care Provider  Learn more about [facility]'s in-office and virtual care options: [facility] Get Care Now
